# Patient Record
Sex: Male | Born: 1995 | Race: White | Hispanic: No | Marital: Single | Smoking: Never smoker
Health system: Southern US, Community
[De-identification: ages and names within clinical notes are randomized; demographics above are authoritative.]

## PROBLEM LIST (undated history)

## (undated) DIAGNOSIS — S069X9A Unspecified intracranial injury with loss of consciousness of unspecified duration, initial encounter: Secondary | ICD-10-CM

## (undated) DIAGNOSIS — F902 Attention-deficit hyperactivity disorder, combined type: Secondary | ICD-10-CM

## (undated) DIAGNOSIS — S069XAA Unspecified intracranial injury with loss of consciousness status unknown, initial encounter: Secondary | ICD-10-CM

## (undated) DIAGNOSIS — R51 Headache: Secondary | ICD-10-CM

## (undated) HISTORY — DX: Attention-deficit hyperactivity disorder, combined type: F90.2

## (undated) HISTORY — DX: Unspecified intracranial injury with loss of consciousness of unspecified duration, initial encounter: S06.9X9A

## (undated) HISTORY — PX: CIRCUMCISION: SHX1350

## (undated) HISTORY — DX: Headache: R51

## (undated) HISTORY — DX: Unspecified intracranial injury with loss of consciousness status unknown, initial encounter: S06.9XAA

---
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
## 2016-06-13 ENCOUNTER — Ambulatory Visit: Payer: No Typology Code available for payment source

## 2019-09-10 ENCOUNTER — Ambulatory Visit: Payer: BLUE CROSS/BLUE SHIELD | Attending: Internal Medicine

## 2019-09-10 DIAGNOSIS — Z20822 Contact with and (suspected) exposure to covid-19: Secondary | ICD-10-CM

## 2019-09-11 LAB — NOVEL CORONAVIRUS, NAA: SARS-CoV-2, NAA: NOT DETECTED

## 2020-06-15 ENCOUNTER — Other Ambulatory Visit: Payer: Self-pay

## 2020-06-15 ENCOUNTER — Emergency Department (HOSPITAL_COMMUNITY): Admission: EM | Admit: 2020-06-15 | Discharge: 2020-06-15 | Discharge status: Against Medical Advice | Payer: 59

## 2020-06-15 NOTE — ED Notes (Signed)
Patient was told by employer he needed a COVID test. Patient decided to seek COVID testing at a local testing site instead of waiting at ED.

## 2021-08-15 ENCOUNTER — Other Ambulatory Visit: Payer: Self-pay

## 2021-08-15 ENCOUNTER — Ambulatory Visit (INDEPENDENT_AMBULATORY_CARE_PROVIDER_SITE_OTHER): Payer: Self-pay | Admitting: Family Medicine

## 2021-08-15 ENCOUNTER — Encounter: Payer: Self-pay | Admitting: Family Medicine

## 2021-08-15 VITALS — BP 126/80 | HR 86 | Temp 98.4°F | Ht 72.0 in | Wt 200.1 lb

## 2021-08-15 DIAGNOSIS — R1084 Generalized abdominal pain: Secondary | ICD-10-CM

## 2021-08-15 DIAGNOSIS — G8929 Other chronic pain: Secondary | ICD-10-CM

## 2021-08-15 DIAGNOSIS — M546 Pain in thoracic spine: Secondary | ICD-10-CM

## 2021-08-15 LAB — CBC WITH DIFFERENTIAL/PLATELET
Basophils Absolute: 0 10*3/uL (ref 0.0–0.1)
Basophils Relative: 0.3 % (ref 0.0–3.0)
Eosinophils Absolute: 0.2 10*3/uL (ref 0.0–0.7)
Eosinophils Relative: 2.2 % (ref 0.0–5.0)
HCT: 51.2 % (ref 39.0–52.0)
Hemoglobin: 17.2 g/dL — ABNORMAL HIGH (ref 13.0–17.0)
Lymphocytes Relative: 21.8 % (ref 12.0–46.0)
Lymphs Abs: 2.2 10*3/uL (ref 0.7–4.0)
MCHC: 33.7 g/dL (ref 30.0–36.0)
MCV: 87.4 fl (ref 78.0–100.0)
Monocytes Absolute: 0.8 10*3/uL (ref 0.1–1.0)
Monocytes Relative: 8 % (ref 3.0–12.0)
Neutro Abs: 6.9 10*3/uL (ref 1.4–7.7)
Neutrophils Relative %: 67.7 % (ref 43.0–77.0)
Platelets: 231 10*3/uL (ref 150.0–400.0)
RBC: 5.85 Mil/uL — ABNORMAL HIGH (ref 4.22–5.81)
RDW: 13.1 % (ref 11.5–15.5)
WBC: 10.3 10*3/uL (ref 4.0–10.5)

## 2021-08-15 LAB — H. PYLORI ANTIBODY, IGG: H Pylori IgG: NEGATIVE

## 2021-08-15 MED ORDER — OMEPRAZOLE 20 MG PO CPDR: 20.0000 mg | DELAYED_RELEASE_CAPSULE | Freq: Every day | ORAL | 3 refills | Status: DC
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
## 2021-08-15 NOTE — Patient Instructions (Signed)
Welcome to [facility]! It was a pleasure meeting you today.  As discussed, Please schedule a 2-3 month follow up visit today. Take omeprazole for 6 weeks.   Do stretches.  Heat.   PLEASE NOTE:  If you had any LAB tests please let us know if you have not heard back within a few days. You may see your results on MyChart before we have a chance to review them but we will give you a call once they are reviewed by [place]. If we ordered any REFERRALS today, please let us know if you have not heard from their office within the next week.  Let us know through MyChart if you are needing REFILLS, or have your pharmacy send [place] the request. You can also use MyChart to communicate with me or any office staff.  Please try these tips to maintain a healthy lifestyle:  Eat most of your calories during the day when you are active. Eliminate processed foods including packaged sweets (pies, cakes, cookies), reduce intake of potatoes, white bread, white pasta, and white rice. Look for whole grain options, oat flour or almond flour.  Each meal should contain half fruits/vegetables, one quarter protein, and one quarter carbs (no bigger than a computer mouse).  Cut down on sweet beverages. This includes juice, soda, and sweet tea. Also watch fruit intake, though this is a healthier sweet option, it still contains natural sugar! Limit to 3 servings daily.  Drink at least 1 glass of water with each meal and aim for at least 8 glasses per day  Exercise at least 150 minutes every week.

## 2021-08-16 LAB — LIPASE: Lipase: 44 U/L (ref 11.0–59.0)

## 2021-08-16 LAB — COMPREHENSIVE METABOLIC PANEL
ALT: 25 U/L (ref 0–53)
AST: 18 U/L (ref 0–37)
Albumin: 5.1 g/dL (ref 3.5–5.2)
Alkaline Phosphatase: 70 U/L (ref 39–117)
BUN: 13 mg/dL (ref 6–23)
CO2: 30 mEq/L (ref 19–32)
Calcium: 10.6 mg/dL — ABNORMAL HIGH (ref 8.4–10.5)
Chloride: 101 mEq/L (ref 96–112)
Creatinine, Ser: 1.34 mg/dL (ref 0.40–1.50)
GFR: 73.9 mL/min (ref >60.00)
Glucose, Bld: 120 mg/dL — ABNORMAL HIGH (ref 70–99)
Potassium: 4.5 mEq/L (ref 3.5–5.1)
Sodium: 145 mEq/L (ref 135–145)
Total Bilirubin: 1.1 mg/dL (ref 0.2–1.2)
Total Protein: 7.9 g/dL (ref 6.0–8.3)

## 2021-08-16 LAB — TSH: TSH: 1.41 u[IU]/mL (ref 0.35–5.50)

## 2021-08-16 LAB — AMYLASE: Amylase: 61 U/L (ref 27–131)

## 2021-08-17 ENCOUNTER — Other Ambulatory Visit: Payer: Self-pay | Admitting: Family Medicine

## 2021-08-17 DIAGNOSIS — D582 Other hemoglobinopathies: Secondary | ICD-10-CM

## 2021-08-17 DIAGNOSIS — R739 Hyperglycemia, unspecified: Secondary | ICD-10-CM

## 2021-08-21 ENCOUNTER — Ambulatory Visit: Payer: BLUE CROSS/BLUE SHIELD | Admitting: Family Medicine

## 2021-10-16 ENCOUNTER — Encounter: Payer: Self-pay | Admitting: Family Medicine

## 2021-10-16 ENCOUNTER — Ambulatory Visit (INDEPENDENT_AMBULATORY_CARE_PROVIDER_SITE_OTHER): Payer: 59 | Admitting: Family Medicine

## 2021-10-16 VITALS — BP 130/70 | HR 103 | Temp 98.7°F | Ht 72.0 in | Wt 201.2 lb

## 2021-10-16 DIAGNOSIS — R739 Hyperglycemia, unspecified: Secondary | ICD-10-CM

## 2021-10-16 DIAGNOSIS — D582 Other hemoglobinopathies: Secondary | ICD-10-CM | POA: Diagnosis not present

## 2021-10-16 DIAGNOSIS — R1084 Generalized abdominal pain: Secondary | ICD-10-CM | POA: Diagnosis not present

## 2021-10-16 LAB — CBC
HCT: 49.4 % (ref 39.0–52.0)
Hemoglobin: 16.9 g/dL (ref 13.0–17.0)
MCHC: 34.2 g/dL (ref 30.0–36.0)
MCV: 86.5 fl (ref 78.0–100.0)
Platelets: 222 10*3/uL (ref 150.0–400.0)
RBC: 5.71 Mil/uL (ref 4.22–5.81)
RDW: 13 % (ref 11.5–15.5)
WBC: 12.8 10*3/uL — ABNORMAL HIGH (ref 4.0–10.5)

## 2021-10-16 LAB — IBC + FERRITIN
Ferritin: 129.7 ng/mL (ref 22.0–322.0)
Iron: 84 ug/dL (ref 42–165)
Saturation Ratios: 23.3 % (ref 20.0–50.0)
TIBC: 361.2 ug/dL (ref 250.0–450.0)
Transferrin: 258 mg/dL (ref 212.0–360.0)

## 2021-10-16 LAB — CALCIUM: Calcium: 10.1 mg/dL (ref 8.4–10.5)

## 2021-10-16 LAB — HEMOGLOBIN A1C: Hgb A1c MFr Bld: 5.6 % (ref 4.6–6.5)

## 2021-10-16 LAB — MAGNESIUM: Magnesium: 2 mg/dL (ref 1.5–2.5)

## 2021-10-16 NOTE — Patient Instructions (Addendum)
It was very nice to see you today! ? ?Will be in touch with labs ?Check pulse ? ? ?PLEASE NOTE: ? ?If you had any lab tests please let us know if you have not heard back within a few days. You may see your results on MyChart before we have a chance to review them but we will give you a call once they are reviewed by [place]. If we ordered any referrals today, please let us know if you have not heard from their office within the next week.  ? ?Please try these tips to maintain a healthy lifestyle: ? ?Eat most of your calories during the day when you are active. Eliminate processed foods including packaged sweets (pies, cakes, cookies), reduce intake of potatoes, white bread, white pasta, and white rice. Look for whole grain options, oat flour or almond flour. ? ?Each meal should contain half fruits/vegetables, one quarter protein, and one quarter carbs (no bigger than a computer mouse). ? ?Cut down on sweet beverages. This includes juice, soda, and sweet tea. Also watch fruit intake, though this is a healthier sweet option, it still contains natural sugar! Limit to 3 servings daily. ? ?Drink at least 1 glass of water with each meal and aim for at least 8 glasses per day ? ?Exercise at least 150 minutes every week.   ?
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
## 2021-10-19 ENCOUNTER — Encounter: Payer: Self-pay | Admitting: Family Medicine

## 2021-11-15 ENCOUNTER — Other Ambulatory Visit: Payer: Self-pay | Admitting: Family Medicine

## 2022-04-02 ENCOUNTER — Encounter: Payer: Self-pay | Admitting: *Deleted

## 2022-05-15 ENCOUNTER — Ambulatory Visit (INDEPENDENT_AMBULATORY_CARE_PROVIDER_SITE_OTHER): Payer: Commercial Managed Care - HMO | Admitting: Family Medicine

## 2022-05-15 ENCOUNTER — Encounter: Payer: Self-pay | Admitting: Family Medicine

## 2022-05-15 VITALS — BP 135/86 | HR 95 | Temp 98.6°F | Ht 69.69 in | Wt 205.4 lb

## 2022-05-15 DIAGNOSIS — L2084 Intrinsic (allergic) eczema: Secondary | ICD-10-CM

## 2022-05-15 MED ORDER — TRIAMCINOLONE ACETONIDE 0.1 % EX CREA: 1.0000 | TOPICAL_CREAM | Freq: Two times a day (BID) | CUTANEOUS | 1 refills | Status: DC
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
## 2022-05-15 NOTE — Patient Instructions (Signed)
It was very nice to see you today!  Dr. Darlina Rumpf  Hand-moisturizer cream-tub-eucerin, cetophil,aquaphor  The steroid cream-2 weeks on and 2 weeks off   PLEASE NOTE:  If you had any lab tests please let us know if you have not heard back within a few days. You may see your results on MyChart before we have a chance to review them but we will give you a call once they are reviewed by [place]. If we ordered any referrals today, please let us know if you have not heard from their office within the next week.   Please try these tips to maintain a healthy lifestyle:  Eat most of your calories during the day when you are active. Eliminate processed foods including packaged sweets (pies, cakes, cookies), reduce intake of potatoes, white bread, white pasta, and white rice. Look for whole grain options, oat flour or almond flour.  Each meal should contain half fruits/vegetables, one quarter protein, and one quarter carbs (no bigger than a computer mouse).  Cut down on sweet beverages. This includes juice, soda, and sweet tea. Also watch fruit intake, though this is a healthier sweet option, it still contains natural sugar! Limit to 3 servings daily.  Drink at least 1 glass of water with each meal and aim for at least 8 glasses per day  Exercise at least 150 minutes every week.

## 2022-07-16 ENCOUNTER — Ambulatory Visit (INDEPENDENT_AMBULATORY_CARE_PROVIDER_SITE_OTHER): Payer: Self-pay | Admitting: Family Medicine

## 2022-07-16 ENCOUNTER — Encounter: Payer: Self-pay | Admitting: Family Medicine

## 2022-07-16 VITALS — BP 130/80 | HR 102 | Temp 98.6°F | Resp 16 | Ht 71.0 in | Wt 213.6 lb

## 2022-07-16 DIAGNOSIS — F902 Attention-deficit hyperactivity disorder, combined type: Secondary | ICD-10-CM

## 2022-07-16 DIAGNOSIS — Z114 Encounter for screening for human immunodeficiency virus [HIV]: Secondary | ICD-10-CM

## 2022-07-16 DIAGNOSIS — R5382 Chronic fatigue, unspecified: Secondary | ICD-10-CM

## 2022-07-16 DIAGNOSIS — Z1159 Encounter for screening for other viral diseases: Secondary | ICD-10-CM

## 2022-07-16 DIAGNOSIS — R739 Hyperglycemia, unspecified: Secondary | ICD-10-CM

## 2022-07-16 LAB — CBC WITH DIFFERENTIAL/PLATELET
Basophils Absolute: 0.1 10*3/uL (ref 0.0–0.1)
Basophils Relative: 0.7 % (ref 0.0–3.0)
Eosinophils Absolute: 0.5 10*3/uL (ref 0.0–0.7)
Eosinophils Relative: 5 % (ref 0.0–5.0)
HCT: 48.2 % (ref 39.0–52.0)
Hemoglobin: 16.5 g/dL (ref 13.0–17.0)
Lymphocytes Relative: 33.2 % (ref 12.0–46.0)
Lymphs Abs: 3.4 10*3/uL (ref 0.7–4.0)
MCHC: 34.2 g/dL (ref 30.0–36.0)
MCV: 88.3 fl (ref 78.0–100.0)
Monocytes Absolute: 0.7 10*3/uL (ref 0.1–1.0)
Monocytes Relative: 6.6 % (ref 3.0–12.0)
Neutro Abs: 5.6 10*3/uL (ref 1.4–7.7)
Neutrophils Relative %: 54.5 % (ref 43.0–77.0)
Platelets: 239 10*3/uL (ref 150.0–400.0)
RBC: 5.46 Mil/uL (ref 4.22–5.81)
RDW: 13.7 % (ref 11.5–15.5)
WBC: 10.3 10*3/uL (ref 4.0–10.5)

## 2022-07-16 LAB — COMPREHENSIVE METABOLIC PANEL
ALT: 30 U/L (ref 0–53)
AST: 21 U/L (ref 0–37)
Albumin: 4.8 g/dL (ref 3.5–5.2)
Alkaline Phosphatase: 74 U/L (ref 39–117)
BUN: 14 mg/dL (ref 6–23)
CO2: 26 mEq/L (ref 19–32)
Calcium: 9.8 mg/dL (ref 8.4–10.5)
Chloride: 105 mEq/L (ref 96–112)
Creatinine, Ser: 1.07 mg/dL (ref 0.40–1.50)
GFR: 96.18 mL/min (ref >60.00)
Glucose, Bld: 97 mg/dL (ref 70–99)
Potassium: 4.1 mEq/L (ref 3.5–5.1)
Sodium: 143 mEq/L (ref 135–145)
Total Bilirubin: 0.7 mg/dL (ref 0.2–1.2)
Total Protein: 7.3 g/dL (ref 6.0–8.3)

## 2022-07-16 LAB — TSH: TSH: 3.04 u[IU]/mL (ref 0.35–5.50)

## 2022-07-16 LAB — TESTOSTERONE: Testosterone: 301.12 ng/dL (ref 300.00–890.00)

## 2022-07-16 LAB — HEMOGLOBIN A1C: Hgb A1c MFr Bld: 5.6 % (ref 4.6–6.5)

## 2022-07-16 MED ORDER — AMPHETAMINE-DEXTROAMPHET ER 10 MG PO CP24: 10.0000 mg | ORAL_CAPSULE | Freq: Every day | ORAL | 0 refills | Status: DC

## 2022-07-16 NOTE — Patient Instructions (Signed)
It was very nice to see you today!  Take adderall in your morning.  Can increase to 2 tabs if needed    PLEASE NOTE:  If you had any lab tests please let us know if you have not heard back within a few days. You may see your results on MyChart before we have a chance to review them but we will give you a call once they are reviewed by [place]. If we ordered any referrals today, please let us know if you have not heard from their office within the next week.   Please try these tips to maintain a healthy lifestyle:  Eat most of your calories during the day when you are active. Eliminate processed foods including packaged sweets (pies, cakes, cookies), reduce intake of potatoes, white bread, white pasta, and white rice. Look for whole grain options, oat flour or almond flour.  Each meal should contain half fruits/vegetables, one quarter protein, and one quarter carbs (no bigger than a computer mouse).  Cut down on sweet beverages. This includes juice, soda, and sweet tea. Also watch fruit intake, though this is a healthier sweet option, it still contains natural sugar! Limit to 3 servings daily.  Drink at least 1 glass of water with each meal and aim for at least 8 glasses per day  Exercise at least 150 minutes every week.
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
## 2022-07-17 ENCOUNTER — Encounter: Payer: Self-pay | Admitting: Family Medicine

## 2022-07-17 LAB — DRUG MONITORING, PANEL 8 WITH CONFIRMATION, URINE
6 Acetylmorphine: NEGATIVE ng/mL (ref <10)
Alcohol Metabolites: NEGATIVE ng/mL (ref <500)
Amphetamines: NEGATIVE ng/mL (ref <500)
Benzodiazepines: NEGATIVE ng/mL (ref <100)
Buprenorphine, Urine: NEGATIVE ng/mL (ref <5)
Cocaine Metabolite: NEGATIVE ng/mL (ref <150)
Creatinine: 201.4 mg/dL (ref >=20.0)
MDMA: NEGATIVE ng/mL (ref <500)
Marijuana Metabolite: NEGATIVE ng/mL (ref <20)
Opiates: NEGATIVE ng/mL (ref <100)
Oxidant: NEGATIVE ug/mL (ref <200)
Oxycodone: NEGATIVE ng/mL (ref <100)
pH: 5.1 (ref 4.5–9.0)

## 2022-07-17 LAB — HIV ANTIBODY (ROUTINE TESTING W REFLEX): HIV 1&2 Ab, 4th Generation: NONREACTIVE

## 2022-07-17 LAB — HEPATITIS C ANTIBODY: Hepatitis C Ab: NONREACTIVE

## 2022-07-17 LAB — DM TEMPLATE

## 2022-07-17 NOTE — Progress Notes (Signed)
Labs look great except testosterone is on low side.  If he is interested in pursuing, please so fasting labs between 8-9am for testosterone including free testosterone, LH,FSH, and prolactin.

## 2022-07-18 ENCOUNTER — Other Ambulatory Visit: Payer: Self-pay | Admitting: *Deleted

## 2022-07-18 DIAGNOSIS — R7989 Other specified abnormal findings of blood chemistry: Secondary | ICD-10-CM

## 2022-07-18 NOTE — Addendum Note (Signed)
Addended by: Zacarias Pontes on: 07/18/2022 04:54 PM   Modules accepted: Orders

## 2022-08-16 ENCOUNTER — Telehealth: Payer: BLUE CROSS/BLUE SHIELD | Admitting: Family Medicine

## 2022-08-16 ENCOUNTER — Encounter: Payer: Self-pay | Admitting: Family Medicine

## 2022-08-16 DIAGNOSIS — F902 Attention-deficit hyperactivity disorder, combined type: Secondary | ICD-10-CM

## 2022-08-16 MED ORDER — AMPHETAMINE-DEXTROAMPHET ER 25 MG PO CP24: 25.0000 mg | ORAL_CAPSULE | ORAL | 0 refills | Status: DC
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
## 2022-08-16 NOTE — Patient Instructions (Signed)
So glad this is helping!  Message in 1 month whether you want the 25 or 30mg 

## 2022-10-02 ENCOUNTER — Other Ambulatory Visit: Payer: Self-pay | Admitting: Family Medicine

## 2022-10-03 MED ORDER — AMPHETAMINE-DEXTROAMPHET ER 25 MG PO CP24: 25.0000 mg | ORAL_CAPSULE | ORAL | 0 refills | Status: DC

## 2022-10-03 NOTE — Telephone Encounter (Signed)
Last refill: 2//24 #30, 0 Last OV: 08/16/22 # dx. ADHD

## 2022-10-19 ENCOUNTER — Encounter: Payer: BLUE CROSS/BLUE SHIELD | Admitting: Family Medicine

## 2022-11-11 ENCOUNTER — Other Ambulatory Visit: Payer: Self-pay | Admitting: Family Medicine

## 2022-11-12 MED ORDER — AMPHETAMINE-DEXTROAMPHET ER 25 MG PO CP24: 25.0000 mg | ORAL_CAPSULE | ORAL | 0 refills | Status: DC

## 2022-11-12 NOTE — Telephone Encounter (Signed)
Spoke w/ pt and informed him he will be needing a f/u soon . Per pt he will check his job schedule and call back

## 2022-11-13 ENCOUNTER — Other Ambulatory Visit: Payer: Self-pay | Admitting: Family Medicine

## 2022-11-13 NOTE — Telephone Encounter (Signed)
Patient stated that he didn't get a call from the pharmacy, so he wasn't sure if medication was sent. Patient stated that he hasn't scheduled an appointment yet because he wasn't sure if this office took his new insurance.

## 2023-07-14 ENCOUNTER — Encounter: Payer: Self-pay | Admitting: Family Medicine

## 2023-07-17 ENCOUNTER — Encounter: Payer: Self-pay | Admitting: Family Medicine

## 2023-07-17 ENCOUNTER — Ambulatory Visit (INDEPENDENT_AMBULATORY_CARE_PROVIDER_SITE_OTHER): Payer: No Typology Code available for payment source | Admitting: Family Medicine

## 2023-07-17 VITALS — BP 110/78 | HR 133 | Temp 97.5°F | Resp 18 | Ht 71.0 in | Wt 204.0 lb

## 2023-07-17 DIAGNOSIS — K219 Gastro-esophageal reflux disease without esophagitis: Secondary | ICD-10-CM

## 2023-07-17 DIAGNOSIS — F902 Attention-deficit hyperactivity disorder, combined type: Secondary | ICD-10-CM | POA: Diagnosis not present

## 2023-07-17 MED ORDER — OMEPRAZOLE 20 MG PO CPDR: 20.0000 mg | DELAYED_RELEASE_CAPSULE | Freq: Every day | ORAL | 1 refills | Status: DC

## 2023-07-17 MED ORDER — AMPHETAMINE-DEXTROAMPHET ER 30 MG PO CP24: 30.0000 mg | ORAL_CAPSULE | Freq: Every morning | ORAL | 0 refills | Status: DC

## 2023-07-17 NOTE — Patient Instructions (Addendum)
 It was very nice to see you today!  Vitamin D  2000 iu/day   PLEASE NOTE:  If you had any lab tests please let us  know if you have not heard back within a few days. You may see your results on MyChart before we have a chance to review them but we will give you a call once they are reviewed by us . If we ordered any referrals today, please let us  know if you have not heard from their office within the next week.   Please try these tips to maintain a healthy lifestyle:  Eat most of your calories during the day when you are active. Eliminate processed foods including packaged sweets (pies, cakes, cookies), reduce intake of potatoes, white bread, white pasta, and white rice. Look for whole grain options, oat flour or almond flour.  Each meal should contain half fruits/vegetables, one quarter protein, and one quarter carbs (no bigger than a computer mouse).  Cut down on sweet beverages. This includes juice, soda, and sweet tea. Also watch fruit intake, though this is a healthier sweet option, it still contains natural sugar! Limit to 3 servings daily.  Drink at least 1 glass of water with each meal and aim for at least 8 glasses per day  Exercise at least 150 minutes every week.
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
## 2023-07-17 NOTE — Assessment & Plan Note (Signed)
 Chronic.  Mostly controlled on adderall xr 30mg  daily.  Feels needs a little higher.  Since not here in 1 yr, significant tachycardia, will renew the 30, check UDS and f/u 1 mo.  Pdmp checked

## 2023-07-17 NOTE — Assessment & Plan Note (Signed)
 Chronic.  Not controlled.  Restart omeprazole 20mg  daiily

## 2023-07-21 LAB — DRUG MONITORING, PANEL 8 WITH CONFIRMATION, URINE
6 Acetylmorphine: NEGATIVE ng/mL (ref <10)
Alcohol Metabolites: POSITIVE ng/mL — AB (ref <500)
Amphetamine: >15000 ng/mL — ABNORMAL HIGH (ref <250)
Amphetamines: POSITIVE ng/mL — AB (ref <500)
Benzodiazepines: NEGATIVE ng/mL (ref <100)
Buprenorphine, Urine: NEGATIVE ng/mL (ref <5)
Cocaine Metabolite: NEGATIVE ng/mL (ref <150)
Creatinine: >300 mg/dL (ref >=20.0)
Ethyl Glucuronide (ETG): >100000 ng/mL — ABNORMAL HIGH (ref <500)
Ethyl Sulfate (ETS): 38792 ng/mL — ABNORMAL HIGH (ref <100)
MDMA: NEGATIVE ng/mL (ref <500)
Marijuana Metabolite: NEGATIVE ng/mL (ref <20)
Methamphetamine: NEGATIVE ng/mL (ref <250)
Opiates: NEGATIVE ng/mL (ref <100)
Oxidant: NEGATIVE ug/mL (ref <200)
Oxycodone: NEGATIVE ng/mL (ref <100)
pH: 5.4 (ref 4.5–9.0)

## 2023-07-21 LAB — DM TEMPLATE

## 2023-07-21 NOTE — Progress Notes (Signed)
 There was a lot of alcohol in the urine-does he drink a lot of alcohol?  Does he need help?

## 2023-08-14 ENCOUNTER — Encounter: Payer: Self-pay | Admitting: Family Medicine

## 2023-08-14 ENCOUNTER — Ambulatory Visit: Payer: No Typology Code available for payment source | Admitting: Family Medicine

## 2023-08-14 DIAGNOSIS — F902 Attention-deficit hyperactivity disorder, combined type: Secondary | ICD-10-CM | POA: Diagnosis not present

## 2023-08-14 MED ORDER — AMPHETAMINE-DEXTROAMPHET ER 20 MG PO CP24
40.0000 mg | ORAL_CAPSULE | Freq: Every day | ORAL | 0 refills | Status: DC
Start: 2023-08-14 — End: 2023-09-11

## 2023-08-14 NOTE — Patient Instructions (Signed)
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
## 2023-09-11 ENCOUNTER — Other Ambulatory Visit: Payer: Self-pay | Admitting: Family Medicine

## 2023-09-11 DIAGNOSIS — F902 Attention-deficit hyperactivity disorder, combined type: Secondary | ICD-10-CM

## 2023-09-12 MED ORDER — AMPHETAMINE-DEXTROAMPHET ER 20 MG PO CP24
40.0000 mg | ORAL_CAPSULE | Freq: Every day | ORAL | 0 refills | Status: DC
Start: 2023-09-12 — End: 2023-10-08

## 2023-10-08 ENCOUNTER — Other Ambulatory Visit: Payer: Self-pay | Admitting: Family Medicine

## 2023-10-08 DIAGNOSIS — F902 Attention-deficit hyperactivity disorder, combined type: Secondary | ICD-10-CM

## 2023-10-08 MED ORDER — AMPHETAMINE-DEXTROAMPHET ER 20 MG PO CP24
40.0000 mg | ORAL_CAPSULE | Freq: Every day | ORAL | 0 refills | Status: DC
Start: 2023-10-08 — End: 2023-12-11

## 2023-11-08 ENCOUNTER — Encounter: Payer: Self-pay | Admitting: Family Medicine

## 2023-11-08 ENCOUNTER — Ambulatory Visit (INDEPENDENT_AMBULATORY_CARE_PROVIDER_SITE_OTHER): Admitting: Family Medicine

## 2023-11-08 VITALS — BP 133/86 | HR 103 | Temp 98.8°F | Resp 16 | Ht 71.0 in | Wt 201.1 lb

## 2023-11-08 DIAGNOSIS — F902 Attention-deficit hyperactivity disorder, combined type: Secondary | ICD-10-CM

## 2023-11-08 DIAGNOSIS — R21 Rash and other nonspecific skin eruption: Secondary | ICD-10-CM

## 2023-11-08 DIAGNOSIS — K219 Gastro-esophageal reflux disease without esophagitis: Secondary | ICD-10-CM | POA: Diagnosis not present

## 2023-11-08 MED ORDER — AMPHETAMINE-DEXTROAMPHET ER 20 MG PO CP24: 40.0000 mg | ORAL_CAPSULE | ORAL | 0 refills | Status: DC

## 2023-11-08 MED ORDER — AMPHETAMINE-DEXTROAMPHET ER 20 MG PO CP24
40.0000 mg | ORAL_CAPSULE | ORAL | 0 refills | Status: DC
Start: 2023-12-08 — End: 2023-12-11

## 2023-11-08 MED ORDER — TRIAMCINOLONE ACETONIDE 0.1 % EX CREA: 1.0000 | TOPICAL_CREAM | Freq: Two times a day (BID) | CUTANEOUS | 0 refills | Status: DC
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
## 2023-11-08 NOTE — Patient Instructions (Signed)
 Moisturizer cream daily  Prescription twice/day-max 2 wks at a time

## 2023-11-14 ENCOUNTER — Ambulatory Visit: Payer: No Typology Code available for payment source | Admitting: Family Medicine

## 2023-12-10 ENCOUNTER — Encounter: Payer: Self-pay | Admitting: Family Medicine

## 2023-12-10 ENCOUNTER — Telehealth: Admitting: Emergency Medicine

## 2023-12-10 DIAGNOSIS — F902 Attention-deficit hyperactivity disorder, combined type: Secondary | ICD-10-CM

## 2023-12-10 NOTE — Progress Notes (Signed)
  Virtual Visit Consent   Samuel Arroyo, you are scheduled for a virtual visit with a [facility] provider today. Just as with appointments in the office, your consent must be obtained to participate. Your consent will be active for this visit and any virtual visit you may have with one of our providers in the next 365 days. If you have a MyChart account, a copy of this consent can be sent to you electronically.  As this is a virtual visit, video technology does not allow for your provider to perform a traditional examination. This may limit your provider's ability to fully assess your condition. If your provider identifies any concerns that need to be evaluated in person or the need to arrange testing (such as labs, EKG, etc.), we will make arrangements to do so. Although advances in technology are sophisticated, we cannot ensure that it will always work on either your end or our end. If the connection with a video visit is poor, the visit may have to be switched to a telephone visit. With either a video or telephone visit, we are not always able to ensure that we have a secure connection.  By engaging in this virtual visit, you consent to the provision of healthcare and authorize for your insurance to be billed (if applicable) for the services provided during this visit. Depending on your insurance coverage, you may receive a charge related to this service.  I need to obtain your verbal consent now. Are you willing to proceed with your visit today? Samuel Arroyo has provided verbal consent on 12/10/2023 for a virtual visit (video or telephone). Blinda Burger, NP  Date: 12/10/2023 9:00 AM   Virtual Visit via Video Note   I, Blinda Burger, connected with  Samuel Arroyo  ([id number], 05/28/1996) on 12/10/23 at  9:00 AM EDT by a video-enabled telemedicine application and verified that I am speaking with the correct person using two identifiers.  Location: Patient: Virtual Visit Location Patient: Other: parked  car in [place] Provider: Virtual Visit Location Provider: Home Office   I discussed the limitations of evaluation and management by telemedicine and the availability of in person appointments. The patient expressed understanding. He did not realize this appt was with Virtual urgent care. He has a specific question for his pcp and wanted to speak with her. We ended the call.   Blinda Burger, NP

## 2023-12-11 ENCOUNTER — Other Ambulatory Visit: Payer: Self-pay | Admitting: Family Medicine

## 2023-12-11 MED ORDER — AMPHETAMINE-DEXTROAMPHET ER 20 MG PO CP24: 40.0000 mg | ORAL_CAPSULE | ORAL | 0 refills | Status: DC

## 2023-12-13 ENCOUNTER — Telehealth: Payer: Self-pay | Admitting: *Deleted

## 2023-12-13 NOTE — Telephone Encounter (Signed)
 Spoke to pharmacy and they stated they would get medication ready for the patient.

## 2023-12-13 NOTE — Telephone Encounter (Signed)
 Copied from CRM [id number]. Topic: Clinical - Prescription Issue >> Dec 13, 2023 12:09 PM Samuel Arroyo wrote: Reason for CRM: Pt advise Pharmacy is stating they never receive the RX for  amphetamine -dextroamphetamine (ADDERALL XR) 20 MG 24 hr capsule for June, as he would be leaving out the country,

## 2024-02-10 ENCOUNTER — Ambulatory Visit: Admitting: Family Medicine

## 2024-02-28 ENCOUNTER — Ambulatory Visit (INDEPENDENT_AMBULATORY_CARE_PROVIDER_SITE_OTHER): Admitting: Family Medicine

## 2024-02-28 ENCOUNTER — Encounter: Payer: Self-pay | Admitting: Family Medicine

## 2024-02-28 VITALS — BP 132/87 | HR 85 | Temp 97.7°F | Resp 16 | Ht 71.0 in | Wt 208.4 lb

## 2024-02-28 DIAGNOSIS — F902 Attention-deficit hyperactivity disorder, combined type: Secondary | ICD-10-CM | POA: Diagnosis not present

## 2024-02-28 MED ORDER — AMPHETAMINE-DEXTROAMPHET ER 20 MG PO CP24: 40.0000 mg | ORAL_CAPSULE | ORAL | 0 refills | Status: DC
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
## 2024-02-28 NOTE — Patient Instructions (Signed)

## 2024-04-03 ENCOUNTER — Other Ambulatory Visit: Payer: Self-pay | Admitting: Family Medicine

## 2024-04-03 NOTE — Telephone Encounter (Signed)
 Copied from CRM [id number]. Topic: Clinical - Medication Refill >> Apr 03, 2024 12:26 PM Taleah C wrote: Medication: Adderall or 3 months  Has the patient contacted their pharmacy? Yes   This is the patient's preferred pharmacy:  [id number] [place], [place] - [address] [place] [address] Phone: [phone] Fax: [phone]  Is this the correct pharmacy for this prescription? Yes If no, delete pharmacy and type the correct one.   Has the prescription been filled recently? Yes  Is the patient out of the medication? Yes  Has the patient been seen for an appointment in the last year OR does the patient have an upcoming appointment? Yes  Can we respond through MyChart? Yes  Agent: Please be advised that Rx refills may take up to 3 business days. We ask that you follow-up with your pharmacy.

## 2024-04-04 ENCOUNTER — Encounter: Payer: Self-pay | Admitting: Family Medicine

## 2024-04-06 ENCOUNTER — Telehealth: Payer: Self-pay

## 2024-04-06 MED ORDER — AMPHETAMINE-DEXTROAMPHET ER 20 MG PO CP24: 40.0000 mg | ORAL_CAPSULE | ORAL | 0 refills | Status: DC

## 2024-04-06 NOTE — Telephone Encounter (Signed)
 Opened in error

## 2024-04-06 NOTE — Telephone Encounter (Signed)
 Refill request for Adderall 20mg  60cap 0refill; Last fill 02/28/2024; Last OV 02/28/2024

## 2024-05-04 ENCOUNTER — Encounter: Payer: Self-pay | Admitting: Family Medicine

## 2024-05-05 ENCOUNTER — Other Ambulatory Visit: Payer: Self-pay | Admitting: Family Medicine

## 2024-05-05 MED ORDER — AMPHETAMINE-DEXTROAMPHET ER 20 MG PO CP24: 40.0000 mg | ORAL_CAPSULE | ORAL | 0 refills | Status: AC

## 2024-05-05 NOTE — Telephone Encounter (Signed)
 Refill request for Adderall. Last fill on 04/06/24. Last OV 11/08/23.

## 2024-05-16 ENCOUNTER — Encounter: Payer: Self-pay | Admitting: Family Medicine

## 2024-05-19 NOTE — Telephone Encounter (Signed)
Noted smk

## 2024-05-29 ENCOUNTER — Encounter: Payer: Self-pay | Admitting: Family Medicine

## 2024-05-29 ENCOUNTER — Ambulatory Visit (INDEPENDENT_AMBULATORY_CARE_PROVIDER_SITE_OTHER): Admitting: Family Medicine

## 2024-05-29 VITALS — BP 134/80 | HR 121 | Temp 99.1°F | Ht 71.0 in | Wt 211.6 lb

## 2024-05-29 DIAGNOSIS — F902 Attention-deficit hyperactivity disorder, combined type: Secondary | ICD-10-CM

## 2024-05-29 DIAGNOSIS — R21 Rash and other nonspecific skin eruption: Secondary | ICD-10-CM | POA: Diagnosis not present

## 2024-05-29 MED ORDER — TRIAMCINOLONE ACETONIDE 0.1 % EX CREA
1.0000 | TOPICAL_CREAM | Freq: Two times a day (BID) | CUTANEOUS | 1 refills | Status: AC
Start: 2024-05-29 — End: ?

## 2024-05-29 MED ORDER — AMPHETAMINE-DEXTROAMPHET ER 20 MG PO CP24: 40.0000 mg | ORAL_CAPSULE | ORAL | 0 refills | Status: AC
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
## 2024-05-29 NOTE — Patient Instructions (Signed)

## 2024-06-11 ENCOUNTER — Encounter: Payer: Self-pay | Admitting: Family Medicine

## 2024-06-11 ENCOUNTER — Other Ambulatory Visit (HOSPITAL_COMMUNITY)
Admission: RE | Admit: 2024-06-11 | Discharge: 2024-06-11 | Disposition: A | Source: Ambulatory Visit | Attending: Family Medicine | Admitting: Family Medicine

## 2024-06-11 ENCOUNTER — Ambulatory Visit (INDEPENDENT_AMBULATORY_CARE_PROVIDER_SITE_OTHER): Admitting: Family Medicine

## 2024-06-11 ENCOUNTER — Ambulatory Visit: Payer: Self-pay | Admitting: *Deleted

## 2024-06-11 VITALS — BP 120/82 | HR 105 | Temp 97.4°F | Ht 71.0 in | Wt 209.6 lb

## 2024-06-11 DIAGNOSIS — Z114 Encounter for screening for human immunodeficiency virus [HIV]: Secondary | ICD-10-CM

## 2024-06-11 DIAGNOSIS — N481 Balanitis: Secondary | ICD-10-CM | POA: Insufficient documentation

## 2024-06-11 DIAGNOSIS — Z1159 Encounter for screening for other viral diseases: Secondary | ICD-10-CM

## 2024-06-11 MED ORDER — KETOCONAZOLE 2 % EX CREA: 1.0000 | TOPICAL_CREAM | Freq: Two times a day (BID) | CUTANEOUS | 0 refills | Status: AC

## 2024-06-11 NOTE — Telephone Encounter (Signed)
Pt scheduled for appt.

## 2024-06-11 NOTE — Progress Notes (Signed)
 Acute Office Visit  Subjective:     Patient ID: Samuel Arroyo, male    DOB: [birth year], 28 y.o.   MRN: [id number]  Chief Complaint  Patient presents with   Rash    Itchy rash noted on the penis and left index finger x1 week, seen at an urgent care, diagnosed with allergic reaction, tried antibiotic cream with no relief    Rash  Discussed the use of AI scribe software for clinical note transcription with the patient, who gave verbal consent to proceed.  History of Present Illness   Samuel Arroyo is a 28 year old male who presents with a rash on his fingers and genital area.  About one week ago, he developed a swollen, intensely itchy "puffy" rash on his fingers, resembling a large bug bite. Over-the-counter allergy medication gave only partial relief of swelling. He denies new foods, lotions, or outdoor exposures.  He also developed painful ulcerations in the genital area that are irritated by clothing but not itchy, with associated burning in the anal area. Antibacterial ointment has not helped. He has not been sexually active for over a year. He recalls a similar rash in the past that resolved without treatment on this fingers but not in his groin. He denies recent bug bites, fever, chills, or penile discharge.       Review of Systems  Skin:  Positive for rash.  All other systems reviewed and are negative.       Objective:    BP 120/82   Pulse (!) 105   Temp (!) 97.4 F (36.3 C) (Oral)   Ht 5' 11 (1.803 m)   Wt 209 lb 9.6 oz (95.1 kg)   SpO2 98%   BMI 29.23 kg/m    Physical Exam Vitals reviewed.  Constitutional:      Appearance: Normal appearance. He is normal weight.  Pulmonary:     Effort: Pulmonary effort is normal.  Genitourinary:    Penis: Circumcised. Erythema (there is sloughing of the skin on the glans of the penis, there are 2 shallow ulcerated areas on the underside of the penis, no foul odor or penial discharge.) present.      Testes: Normal.  Skin:     Findings: Lesion (left index finger there is a welt like area that is slightly erythematous, also surrounding this an area of darkening and mild edema, no open wounds or drainage. there is also one on the right third finger on the tip of the finger) present.  Neurological:     Mental Status: He is alert and oriented to person, place, and time. Mental status is at baseline.     No results found for any visits on 06/11/24.      Assessment & Plan:   Problem List Items Addressed This Visit   None Visit Diagnoses       Balanitis    -  Primary   Relevant Medications   ketoconazole (NIZORAL) 2 % cream   Other Relevant Orders   RPR W/RFLX TO RPR TITER, TREPONEMAL AB, SCREEN AND DIAGNOSIS     Need for hepatitis C screening test       Relevant Orders   Hepatitis C antibody     Encounter for screening for HIV       Relevant Orders   HIV Antibody (routine testing w rflx)     Assessment and Plan    Genital and perianal candidal infection Ulcerations and burning sensation in the genital and perianal area, likely  due to yeast overgrowth. Differential diagnosis includes STIs, but yeast infection is more likely given the presentation and lack of recent sexual activity. Antibacterial ointment was ineffective, suggesting a fungal etiology. - Prescribed ketoconazole cream to apply twice daily for 7-14 days. - Performed swab test for STI screening. - Ordered blood tests including RPR, hep C, and HIV. - Provided Curlex gauze for protection and to prevent irritation from clothing. - Advised to apply cream to affected areas including backside. - If symptoms persist despite treatment, will consider referral to urologist.  Pruritic rash of hands Pruritic rash on hands, presenting as puffy welts, possibly improving. Differential diagnosis includes allergic reaction or insect bite, but exact cause is unclear. Rash could still be related to yeast infection despite its different appearance, and further  testing is needed to be sure. - Advised to apply ketoconazole cream to hands as well. - Instructed to monitor rash for improvement and report if symptoms persist.  General Health Maintenance Discussed the importance of STI screening despite lack of recent sexual activity due to potential asymptomatic infections. - Performed STI screening including swab and blood tests.        Meds ordered this encounter  Medications   ketoconazole (NIZORAL) 2 % cream    Sig: Apply 1 Application topically 2 (two) times daily.    Dispense:  60 g    Refill:  0    No follow-ups on file.  Heron CHRISTELLA Sharper, MD

## 2024-06-11 NOTE — Telephone Encounter (Signed)
 FYI Only or Action Required?: FYI only for provider: appointment scheduled on 12/4.  Patient was last seen in primary care on 05/29/2024 by Wendolyn Jenkins Jansky, MD.  Called Nurse Triage reporting Insect Bite.  Symptoms began a week ago.  Interventions attempted: OTC medications: antibiotic cream, allergy medication.  Symptoms are: gradually improving.  Triage Disposition: See PCP When Office is Open (Within 3 Days), Home Care  Patient/caregiver understands and will follow disposition?: Yes  Copied from CRM [id number]. Topic: Clinical - Red Word Triage >> Jun 11, 2024  8:33 AM Amber H wrote: [facility] that prompted transfer to Nurse Triage: Patient stated he was bit by some type of insect on different spots on his body. Bites looked like blisters. Went to Urgent Care 3 days ago and prescribed OTC for allergic reaction. Still have symptoms and appears worse. Itching, looks like scabs, redness, no swelling. Thinks it was a spider. Pain when clothes touches spot. Reason for Disposition  Painful insect bite  All other penis - scrotum symptoms  (Exception: Painless rash < 24 hours duration.)  Answer Assessment - Initial Assessment Questions 1. TYPE of INSECT: What type of insect was it?      Patient states he may have gotten bitten by spider on finger 2. ONSET: When did you get bitten?      11/27 3. LOCATION: Where is the insect bite located?      L index finger,( may also have R fitth digit bite) 4. REDNESS: Is the area red or pink? If Yes, ask: What size is the area of redness? (inches or cm). When did the redness start?     Redness improved and swelling has improved 5. PAIN: Is there any pain? If Yes, ask: How bad is the pain? (Scale 0-10; or none, mild, moderate, severe)     Pain with warm water 6. ITCHING: Does it itch? If Yes, ask: How bad is the itch?      no 7. SWELLING: How big is the swelling? (e.g., inches, cm, or compare to coins)     No swelling- just slight  red and bite mark 8. OTHER SYMPTOMS: Do you have any other symptoms?  (e.g., difficulty breathing, fever, hives)     Another rash in private area  Answer Assessment - Initial Assessment Questions 1. SYMPTOM: What's the main symptom you're concerned about? (e.g., blood in semen, discharge or pus from penis, itching, pain, rash, swelling)     Rash on penis- tip, has blisters, peeling skin, scab present, 2 open wound areas 2. LOCATION: Where is the rash located?     tip 3. ONSET: When did rash  start?     11/27 4. PAIN: Is there any pain? If Yes, ask: How bad is it?  (Scale 1-10; or mild, moderate, severe)     Yes- pain with friction- clothing 5. URINE: Any difficulty passing urine? If Yes, ask: When was the last time?     no 6. CAUSE: What do you think is causing the symptoms?     Unsure  7. OTHER SYMPTOMS: Do you have any other symptoms? (e.g., blood in urine, abdomen pain, fever)     no  Protocols used: Insect Bite-A-AH, Penis and Scrotum Symptoms-A-AH

## 2024-06-12 ENCOUNTER — Ambulatory Visit: Payer: Self-pay | Admitting: Family Medicine

## 2024-06-12 LAB — HIV ANTIBODY (ROUTINE TESTING W REFLEX)
HIV 1&2 Ab, 4th Generation: NONREACTIVE
HIV FINAL INTERPRETATION: NEGATIVE

## 2024-06-12 LAB — CYTOLOGY, (ORAL, ANAL, URETHRAL) ANCILLARY ONLY
Chlamydia: NEGATIVE
Comment: NEGATIVE
Comment: NEGATIVE
Comment: NORMAL
Neisseria Gonorrhea: NEGATIVE
Trichomonas: NEGATIVE

## 2024-06-12 LAB — HEPATITIS C ANTIBODY: Hepatitis C Ab: NONREACTIVE

## 2024-06-12 LAB — SYPHILIS: RPR W/REFLEX TO RPR TITER AND TREPONEMAL ANTIBODIES, TRADITIONAL SCREENING AND DIAGNOSIS ALGORITHM: RPR Ser Ql: NONREACTIVE

## 2024-07-14 ENCOUNTER — Telehealth: Payer: Self-pay

## 2024-07-14 NOTE — Telephone Encounter (Signed)
 Copied from CRM [id number]. Topic: Clinical - Prescription Issue >> Jul 14, 2024 12:42 PM Winona R wrote: [facility] for amphetamine -dextroamphetamine (ADDERALL XR) 20 MG 24 hr capsule- Although I only see a 30 day supply. Pt states it should 90 Day supply for 2 a day would like to know if I can be sent to another pharmacy Pt doesn't care which one he would just really like to get them asap. Pt is completely out of the medication at the moment

## 2024-07-15 ENCOUNTER — Encounter: Payer: Self-pay | Admitting: Family Medicine

## 2024-07-15 NOTE — Telephone Encounter (Signed)
Tried to call pt- left message

## 2024-08-31 ENCOUNTER — Ambulatory Visit: Admitting: Family Medicine
